# Patient Record
Sex: Male | Born: 1980 | Race: Black or African American | Hispanic: No | Marital: Single | Smoking: Heavy tobacco smoker
Health system: Southern US, Community
[De-identification: ages and names within clinical notes are randomized; demographics above are authoritative.]

---

## 2000-02-05 ENCOUNTER — Emergency Department (HOSPITAL_COMMUNITY): Admission: EM | Admit: 2000-02-05 | Discharge: 2000-02-05 | Payer: Self-pay | Admitting: Emergency Medicine

## 2008-11-11 ENCOUNTER — Emergency Department (HOSPITAL_COMMUNITY): Admission: EM | Admit: 2008-11-11 | Discharge: 2008-11-11 | Payer: Self-pay | Admitting: Emergency Medicine

## 2010-04-13 ENCOUNTER — Emergency Department (HOSPITAL_COMMUNITY): Admission: EM | Admit: 2010-04-13 | Discharge: 2010-04-13 | Payer: Self-pay | Admitting: Emergency Medicine

## 2010-04-16 ENCOUNTER — Emergency Department (HOSPITAL_COMMUNITY): Admission: EM | Admit: 2010-04-16 | Discharge: 2010-04-16 | Payer: Self-pay | Admitting: Emergency Medicine

## 2010-10-26 LAB — RAPID STREP SCREEN (MED CTR MEBANE ONLY): Streptococcus, Group A Screen (Direct): NEGATIVE

## 2013-04-27 ENCOUNTER — Emergency Department (HOSPITAL_COMMUNITY)
Admission: EM | Admit: 2013-04-27 | Discharge: 2013-04-27 | Disposition: A | Discharge status: Home / Self Care | Payer: Self-pay | Attending: Emergency Medicine | Admitting: Emergency Medicine

## 2013-04-27 ENCOUNTER — Emergency Department (HOSPITAL_COMMUNITY): Payer: Self-pay

## 2013-04-27 ENCOUNTER — Encounter (HOSPITAL_COMMUNITY): Payer: Self-pay | Admitting: Emergency Medicine

## 2013-04-27 DIAGNOSIS — S0083XA Contusion of other part of head, initial encounter: Secondary | ICD-10-CM

## 2013-04-27 DIAGNOSIS — Y9389 Activity, other specified: Secondary | ICD-10-CM | POA: Insufficient documentation

## 2013-04-27 DIAGNOSIS — Y92410 Unspecified street and highway as the place of occurrence of the external cause: Secondary | ICD-10-CM | POA: Insufficient documentation

## 2013-04-27 DIAGNOSIS — IMO0002 Reserved for concepts with insufficient information to code with codable children: Secondary | ICD-10-CM | POA: Insufficient documentation

## 2013-04-27 DIAGNOSIS — S0003XA Contusion of scalp, initial encounter: Secondary | ICD-10-CM | POA: Insufficient documentation

## 2013-04-27 DIAGNOSIS — F17200 Nicotine dependence, unspecified, uncomplicated: Secondary | ICD-10-CM | POA: Insufficient documentation

## 2013-04-27 DIAGNOSIS — R141 Gas pain: Secondary | ICD-10-CM | POA: Insufficient documentation

## 2013-04-27 DIAGNOSIS — R142 Eructation: Secondary | ICD-10-CM | POA: Insufficient documentation

## 2013-04-27 DIAGNOSIS — S3981XA Other specified injuries of abdomen, initial encounter: Secondary | ICD-10-CM | POA: Insufficient documentation

## 2013-04-27 DIAGNOSIS — S022XXA Fracture of nasal bones, initial encounter for closed fracture: Secondary | ICD-10-CM | POA: Insufficient documentation

## 2013-04-27 DIAGNOSIS — T07XXXA Unspecified multiple injuries, initial encounter: Secondary | ICD-10-CM

## 2013-04-27 DIAGNOSIS — S0990XA Unspecified injury of head, initial encounter: Secondary | ICD-10-CM | POA: Insufficient documentation

## 2013-04-27 DIAGNOSIS — S298XXA Other specified injuries of thorax, initial encounter: Secondary | ICD-10-CM | POA: Insufficient documentation

## 2013-04-27 LAB — CBC WITH DIFFERENTIAL/PLATELET
Basophils Absolute: 0 10*3/uL (ref 0.0–0.1)
HCT: 43 % (ref 39.0–52.0)
Hemoglobin: 15.3 g/dL (ref 13.0–17.0)
Lymphocytes Relative: 52 % — ABNORMAL HIGH (ref 12–46)
MCHC: 35.6 g/dL (ref 30.0–36.0)
MCV: 94.1 fL (ref 78.0–100.0)
Monocytes Absolute: 0.5 10*3/uL (ref 0.1–1.0)
Monocytes Relative: 6 % (ref 3–12)
Neutro Abs: 3.5 10*3/uL (ref 1.7–7.7)
RDW: 13.4 % (ref 11.5–15.5)
WBC: 8.4 10*3/uL (ref 4.0–10.5)

## 2013-04-27 LAB — COMPREHENSIVE METABOLIC PANEL
AST: 22 U/L (ref 0–37)
Albumin: 4.7 g/dL (ref 3.5–5.2)
Alkaline Phosphatase: 70 U/L (ref 39–117)
BUN: 14 mg/dL (ref 6–23)
CO2: 26 mEq/L (ref 19–32)
Calcium: 9 mg/dL (ref 8.4–10.5)
Chloride: 99 mEq/L (ref 96–112)
Creatinine, Ser: 0.84 mg/dL (ref 0.50–1.35)
GFR calc Af Amer: >90 mL/min (ref >90)
GFR calc non Af Amer: >90 mL/min (ref >90)
Sodium: 135 mEq/L (ref 135–145)
Total Bilirubin: 0.9 mg/dL (ref 0.3–1.2)

## 2013-04-27 LAB — URINALYSIS, ROUTINE W REFLEX MICROSCOPIC
Glucose, UA: NEGATIVE mg/dL
Leukocytes, UA: NEGATIVE
Nitrite: NEGATIVE
Protein, ur: NEGATIVE mg/dL
Specific Gravity, Urine: 1.015 (ref 1.005–1.030)
Urobilinogen, UA: 1 mg/dL (ref 0.0–1.0)

## 2013-04-27 LAB — ETHANOL: Alcohol, Ethyl (B): 311 mg/dL — ABNORMAL HIGH (ref 0–11)

## 2013-04-27 LAB — URINE MICROSCOPIC-ADD ON

## 2013-04-27 LAB — RAPID URINE DRUG SCREEN, HOSP PERFORMED
Amphetamines: NOT DETECTED
Barbiturates: NOT DETECTED
Opiates: NOT DETECTED

## 2013-04-27 MED ORDER — HYDROCODONE-ACETAMINOPHEN 5-325 MG PO TABS: 1.0000 | ORAL_TABLET | Freq: Four times a day (QID) | ORAL | Status: DC | PRN

## 2013-04-27 MED ORDER — ONDANSETRON HCL 4 MG/2ML IJ SOLN
4.0000 mg | Freq: Once | INTRAMUSCULAR | Status: AC
  Administered 2013-04-27: 4 mg via INTRAVENOUS
  Filled 2013-04-27: qty 2

## 2013-04-27 MED ORDER — IOHEXOL 300 MG/ML  SOLN
100.0000 mL | Freq: Once | INTRAMUSCULAR | Status: AC | PRN
  Administered 2013-04-27: 100 mL via INTRAVENOUS

## 2013-04-27 MED ORDER — HYDROMORPHONE HCL PF 1 MG/ML IJ SOLN
1.0000 mg | Freq: Once | INTRAMUSCULAR | Status: AC
  Administered 2013-04-27: 1 mg via INTRAVENOUS
  Filled 2013-04-27: qty 1

## 2013-04-27 MED ORDER — SODIUM CHLORIDE 0.9 % IV SOLN
Freq: Once | INTRAVENOUS | Status: AC
  Administered 2013-04-27: 04:00:00 via INTRAVENOUS

## 2013-04-27 MED ORDER — HYDROGEN PEROXIDE 3 % EX SOLN
CUTANEOUS | Status: AC
  Filled 2013-04-27: qty 473

## 2013-04-27 NOTE — ED Provider Notes (Signed)
Medical screening examination/treatment/procedure(s) were performed by non-physician practitioner and as supervising physician I was immediately available for consultation/collaboration.  Olivia Mackie, MD 04/27/13 [phone]
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
## 2013-04-27 NOTE — ED Notes (Signed)
Spoke with [place] at lab,  She is adding on Ethanol to lab tube already in lab

## 2013-04-27 NOTE — ED Notes (Signed)
Pt discharged and released to jail with [employer]

## 2013-07-06 ENCOUNTER — Emergency Department (HOSPITAL_COMMUNITY)
Admission: EM | Admit: 2013-07-06 | Discharge: 2013-07-07 | Discharge status: Against Medical Advice | Payer: Self-pay | Attending: Emergency Medicine | Admitting: Emergency Medicine

## 2013-07-06 ENCOUNTER — Encounter (HOSPITAL_COMMUNITY): Payer: Self-pay | Admitting: Emergency Medicine

## 2013-07-06 DIAGNOSIS — R109 Unspecified abdominal pain: Secondary | ICD-10-CM

## 2013-07-06 DIAGNOSIS — R112 Nausea with vomiting, unspecified: Secondary | ICD-10-CM | POA: Insufficient documentation

## 2013-07-06 DIAGNOSIS — R197 Diarrhea, unspecified: Secondary | ICD-10-CM | POA: Insufficient documentation

## 2013-07-06 DIAGNOSIS — F17200 Nicotine dependence, unspecified, uncomplicated: Secondary | ICD-10-CM | POA: Insufficient documentation

## 2013-07-06 NOTE — ED Provider Notes (Signed)
CSN: [id number]     Arrival date & time 07/06/13  1134 History   First MD Initiated Contact with Patient 07/06/13 1215     Chief Complaint  Patient presents with  . Abdominal Pain  . Diarrhea  . Nausea   (Consider location/radiation/quality/duration/timing/severity/associated sxs/prior Treatment) HPI  History reviewed. No pertinent past medical history. History reviewed. No pertinent past surgical history. No family history on file. History  Substance Use Topics  . Smoking status: Heavy Tobacco Smoker -- 1.00 packs/day    Types: Cigarettes  . Smokeless tobacco: Not on file  . Alcohol Use: Yes    Review of Systems  Allergies  Review of patient's allergies indicates no known allergies.  Home Medications   Current Outpatient Rx  Name  Route  Sig  Dispense  Refill  . HYDROcodone-acetaminophen (NORCO/VICODIN) 5-325 MG per tablet   Oral   Take 1 tablet by mouth every 6 (six) hours as needed for pain.   10 tablet   0    BP 116/80  Pulse 74  Temp(Src) 97.7 F (36.5 C) (Oral)  Resp 20  Ht 5\' 7"  (1.702 m)  Wt 165 lb (74.844 kg)  BMI 25.84 kg/m2  SpO2 100% Physical Exam  ED Course  Procedures (including critical care time) Labs Review Labs Reviewed  CBC WITH DIFFERENTIAL  COMPREHENSIVE METABOLIC PANEL  URINALYSIS, ROUTINE W REFLEX MICROSCOPIC   Imaging Review No results found.  EKG Interpretation   None       MDM  Patient not in room for me to evaluate.    Izola Price Marisue Humble, PA-C 07/06/13 1259

## 2013-07-06 NOTE — ED Notes (Signed)
Pt c/o mid abdominal pain with diarrhea, vomiting and nausea onset Friday. Pt reports others at his job with similar symptoms. Pt has not tried any other medications for symptoms.

## 2013-12-12 ENCOUNTER — Emergency Department (HOSPITAL_COMMUNITY): Payer: Managed Care, Other (non HMO)

## 2013-12-12 ENCOUNTER — Encounter (HOSPITAL_COMMUNITY): Payer: Self-pay | Admitting: Emergency Medicine

## 2013-12-12 ENCOUNTER — Emergency Department (HOSPITAL_COMMUNITY)
Admission: EM | Admit: 2013-12-12 | Discharge: 2013-12-12 | Disposition: A | Discharge status: Home / Self Care | Payer: Managed Care, Other (non HMO) | Attending: Emergency Medicine | Admitting: Emergency Medicine

## 2013-12-12 DIAGNOSIS — N508 Other specified disorders of male genital organs: Secondary | ICD-10-CM | POA: Insufficient documentation

## 2013-12-12 DIAGNOSIS — F17200 Nicotine dependence, unspecified, uncomplicated: Secondary | ICD-10-CM | POA: Insufficient documentation

## 2013-12-12 DIAGNOSIS — R300 Dysuria: Secondary | ICD-10-CM | POA: Insufficient documentation

## 2013-12-12 DIAGNOSIS — R109 Unspecified abdominal pain: Secondary | ICD-10-CM | POA: Insufficient documentation

## 2013-12-12 DIAGNOSIS — R1032 Left lower quadrant pain: Secondary | ICD-10-CM

## 2013-12-12 DIAGNOSIS — R1031 Right lower quadrant pain: Secondary | ICD-10-CM

## 2013-12-12 LAB — HIV ANTIBODY (ROUTINE TESTING W REFLEX): HIV 1&2 Ab, 4th Generation: NONREACTIVE

## 2013-12-12 LAB — URINALYSIS, ROUTINE W REFLEX MICROSCOPIC
Bilirubin Urine: NEGATIVE
GLUCOSE, UA: NEGATIVE mg/dL
Ketones, ur: NEGATIVE mg/dL
Nitrite: NEGATIVE
PH: 6 (ref 5.0–8.0)
PROTEIN: NEGATIVE mg/dL
SPECIFIC GRAVITY, URINE: 1.024 (ref 1.005–1.030)
Urobilinogen, UA: 1 mg/dL (ref 0.0–1.0)

## 2013-12-12 LAB — URINE MICROSCOPIC-ADD ON

## 2013-12-12 MED ORDER — CEFTRIAXONE SODIUM 250 MG IJ SOLR
250.0000 mg | Freq: Once | INTRAMUSCULAR | Status: AC
  Administered 2013-12-12: 250 mg via INTRAMUSCULAR
  Filled 2013-12-12: qty 250

## 2013-12-12 MED ORDER — AZITHROMYCIN 250 MG PO TABS
1000.0000 mg | ORAL_TABLET | Freq: Once | ORAL | Status: AC
  Administered 2013-12-12: 1000 mg via ORAL
  Filled 2013-12-12: qty 4

## 2013-12-12 NOTE — ED Notes (Signed)
Pt reports pain in the crease between leg and abd for past week that is intermittent. Pt states L has pain more than R side. No n/v/d.

## 2013-12-12 NOTE — ED Provider Notes (Signed)
CSN: [id number]     Arrival date & time 12/12/13  1618 History   First MD Initiated Contact with Patient 12/12/13 1633     Chief Complaint  Patient presents with  . Abdominal Pain     (Consider location/radiation/quality/duration/timing/severity/associated sxs/prior Treatment) HPI  Patient presents with bilateral inguinal pain, L>R, and burning and itching following urination x 1 week.  Pain in inguinal region is sharp, occurs at random, lasts hours at a time, currently 5/10.  Has burning and itching in the penis after urination.  Denies fevers, N/V, dysuria, urinary frequency or urgency, penile discharge, scrotal swelling or testicular pain, genital lesions, change in bowel habits.  Is sexually active, has had unprotected sex recently with new partner.    Denies hx inguinal hernia, hx abdominal surgery.    History reviewed. No pertinent past medical history. History reviewed. No pertinent past surgical history. History reviewed. No pertinent family history. History  Substance Use Topics  . Smoking status: Heavy Tobacco Smoker -- 1.00 packs/day    Types: Cigarettes  . Smokeless tobacco: Not on file  . Alcohol Use: Yes    Review of Systems  All other systems reviewed and are negative.     Allergies  Review of patient's allergies indicates no known allergies.  Home Medications   Prior to Admission medications   Medication Sig Start Date End Date Taking? Authorizing Provider  HYDROcodone-acetaminophen (NORCO/VICODIN) 5-325 MG per tablet Take 1 tablet by mouth every 6 (six) hours as needed for pain. 04/27/13   Arman Filter, NP   BP 132/92  Pulse 75  Temp(Src) 98.1 F (36.7 C) (Oral)  Resp 16  SpO2 98% Physical Exam  Nursing note and vitals reviewed. Constitutional: He appears well-developed and well-nourished. No distress.  HENT:  Head: Normocephalic and atraumatic.  Neck: Neck supple.  Cardiovascular: Normal rate and regular rhythm.   Pulmonary/Chest: Effort  normal and breath sounds normal. No respiratory distress. He has no wheezes. He has no rales.  Abdominal: Soft. He exhibits no distension and no mass. There is no tenderness. There is no rebound and no guarding. Hernia confirmed negative in the right inguinal area and confirmed negative in the left inguinal area.  Genitourinary: Right testis shows no swelling and no tenderness. Right testis is descended. Left testis shows mass. Left testis shows no tenderness. Left testis is descended. Circumcised. No discharge found.  Lymphadenopathy:       Right: No inguinal adenopathy present.       Left: No inguinal adenopathy present.  Neurological: He is alert. He exhibits normal muscle tone.  Skin: He is not diaphoretic.    ED Course  Procedures (including critical care time) Labs Review Labs Reviewed  URINALYSIS, ROUTINE W REFLEX MICROSCOPIC - Abnormal; Notable for the following:    Hgb urine dipstick TRACE (*)    Leukocytes, UA MODERATE (*)    All other components within normal limits  URINE MICROSCOPIC-ADD ON - Abnormal; Notable for the following:    Bacteria, UA FEW (*)    All other components within normal limits  GC/CHLAMYDIA PROBE AMP  URINE CULTURE  RPR  HIV ANTIBODY (ROUTINE TESTING)    Imaging Review US Scrotum  12/12/2013   CLINICAL DATA:  Inguinal tenderness with left testicular mass  EXAM: SCROTAL ULTRASOUND  DOPPLER ULTRASOUND OF THE TESTICLES  TECHNIQUE: Complete ultrasound examination of the testicles, epididymis, and other scrotal structures was performed. Color and spectral Doppler ultrasound were also utilized to evaluate blood flow to the testicles.  COMPARISON:  None.  FINDINGS: Right testicle  Measurements: 4.3 x 2.2 x 3.9 cm. No mass or microlithiasis visualized.  Left testicle  Measurements: 5.0 x 1.8 x 3.5 cm. No mass or microlithiasis visualized.  Right epididymis:  Normal in size and appearance.  Left epididymis:  Normal in size and appearance.  Hydrocele:  None  visualized.  Varicocele:  None visualized.  Pulsed Doppler interrogation of both testes demonstrates low resistance arterial and venous waveforms bilaterally.  IMPRESSION: No acute abnormality noted.   Electronically Signed   By: Alcide CleverMark  Lukens M.D.   On: 12/12/2013 18:27   [place] Art/ven Flow Abd Pelv Doppler  12/12/2013   CLINICAL DATA:  Inguinal tenderness with left testicular mass  EXAM: SCROTAL ULTRASOUND  DOPPLER ULTRASOUND OF THE TESTICLES  TECHNIQUE: Complete ultrasound examination of the testicles, epididymis, and other scrotal structures was performed. Color and spectral Doppler ultrasound were also utilized to evaluate blood flow to the testicles.  COMPARISON:  None.  FINDINGS: Right testicle  Measurements: 4.3 x 2.2 x 3.9 cm. No mass or microlithiasis visualized.  Left testicle  Measurements: 5.0 x 1.8 x 3.5 cm. No mass or microlithiasis visualized.  Right epididymis:  Normal in size and appearance.  Left epididymis:  Normal in size and appearance.  Hydrocele:  None visualized.  Varicocele:  None visualized.  Pulsed Doppler interrogation of both testes demonstrates low resistance arterial and venous waveforms bilaterally.  IMPRESSION: No acute abnormality noted.   Electronically Signed   By: Alcide CleverMark  Lukens M.D.   On: 12/12/2013 18:27     EKG Interpretation None      Dr Wesley Bolton has also seen and performed genital exam on patient - suspects engorged epididymus.    MDM   Final diagnoses:  Dysuria  Inguinal pain of both sides    Pt with bilateral inguinal pain without adenopathy or hernia, pain/burning after urination and recent unprotected sex with new partner.  Pt without testicular pain but found to have mass on exam, normal US, Dr Wesley Bolton has also performed genital exam (see above).  Likely STD.  Pt opted to be treated for GC/Chlam.  Pt aware all STD/HIV results are pending.  D/C home.  Discussed result, findings, treatment, and follow up  with patient.  Pt given return precautions.  Pt  verbalizes understanding and agrees with plan.         [name], PA-C 12/12/13 2154

## 2013-12-12 NOTE — ED Notes (Addendum)
Patient reports pain he rates as a 7 in his left groin area.  Discomfort when he urinates but no penile discharge.  This has been going on for about a week.  Patient said he had unprotected sex with new partner about three days prior to symptoms.

## 2013-12-12 NOTE — ED Provider Notes (Signed)
Medical screening examination/treatment/procedure(s) were conducted as a shared visit with non-physician practitioner(s) and myself.  I personally evaluated the patient during the encounter.   EKG Interpretation None      Patient here with bilateral inguinal pain. No penile or testicluar pain. Small mass on exam above L testicle, c/w engorged epididymis. [place] normal.   Dagmar Hait, MD 12/12/13 [phone]

## 2013-12-12 NOTE — Discharge Instructions (Signed)
Read the information below.  You may return to the Emergency Department at any time for worsening condition or any new symptoms that concern you.   Dysuria Dysuria is the medical term for pain with urination. There are many causes for dysuria, but urinary tract infection is the most common. If a urinalysis was performed it can show that there is a urinary tract infection. A urine culture confirms that you or your child is sick. You will need to follow up with a healthcare provider because:  If a urine culture was done you will need to know the culture results and treatment recommendations.  If the urine culture was positive, you or your child will need to be put on antibiotics or know if the antibiotics prescribed are the right antibiotics for your urinary tract infection.  If the urine culture is negative (no urinary tract infection), then other causes may need to be explored or antibiotics need to be stopped. Today laboratory work may have been done and there does not seem to be an infection. If cultures were done they will take at least 24 to 48 hours to be completed. Today x-rays may have been taken and they read as normal. No cause can be found for the problems. The x-rays may be re-read by a radiologist and you will be contacted if additional findings are made. You or your child may have been put on medications to help with this problem until you can see your primary caregiver. If the problems get better, see your primary caregiver if the problems return. If you were given antibiotics (medications which kill germs), take all of the mediations as directed for the full course of treatment.  If laboratory work was done, you need to find the results. Leave a telephone number where you can be reached. If this is not possible, make sure you find out how you are to get test results. HOME CARE INSTRUCTIONS   Drink lots of fluids. For adults, drink eight, 8 ounce glasses of clear juice or water a day.  For children, replace fluids as suggested by your caregiver.  Empty the bladder often. Avoid holding urine for long periods of time.  After a bowel movement, women should cleanse front to back, using each tissue only once.  Empty your bladder before and after sexual intercourse.  Take all the medicine given to you until it is gone. You may feel better in a few days, but TAKE ALL MEDICINE.  Avoid caffeine, tea, alcohol and carbonated beverages, because they tend to irritate the bladder.  In men, alcohol may irritate the prostate.  Only take over-the-counter or prescription medicines for pain, discomfort, or fever as directed by your caregiver.  If your caregiver has given you a follow-up appointment, it is very important to keep that appointment. Not keeping the appointment could result in a chronic or permanent injury, pain, and disability. If there is any problem keeping the appointment, you must call back to this facility for assistance. SEEK IMMEDIATE MEDICAL CARE IF:   Back pain develops.  A fever develops.  There is nausea (feeling sick to your stomach) or vomiting (throwing up).  Problems are no better with medications or are getting worse. MAKE SURE YOU:   Understand these instructions.  Will watch your condition.  Will get help right away if you are not doing well or get worse. Document Released: 03/31/2004 Document Revised: 09/25/2011 Document Reviewed: 02/06/2008 [facility] Patient Information 2014 [place], [place].

## 2013-12-13 LAB — GC/CHLAMYDIA PROBE AMP
CT PROBE, AMP APTIMA: NEGATIVE
GC Probe RNA: NEGATIVE

## 2013-12-13 LAB — RPR

## 2013-12-14 LAB — URINE CULTURE
Colony Count: NO GROWTH
Culture: NO GROWTH
SPECIAL REQUESTS: NORMAL

## 2016-03-15 IMAGING — US US SCROTUM
1 series · 14 of 25 positions shown · non-contrast
Comparison: None.

CLINICAL DATA: Inguinal tenderness with left testicular mass

EXAM:
SCROTAL ULTRASOUND
DOPPLER ULTRASOUND OF THE TESTICLES
TECHNIQUE: Complete ultrasound examination of the testicles, epididymis, and
other scrotal structures was performed. Color and spectral Doppler
ultrasound were also utilized to evaluate blood flow to the
testicles.

[Series 1: us scrotum · 0.09mm/px · 14 of 32 slices shown]
[im 1/32]
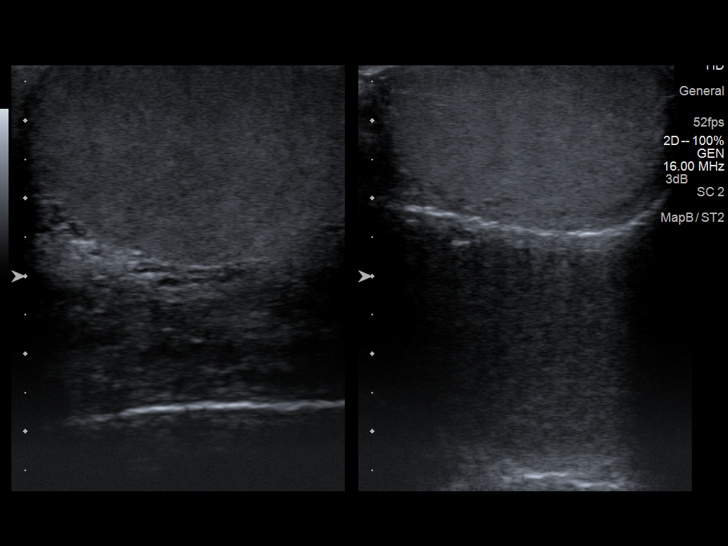
[im 3/32]
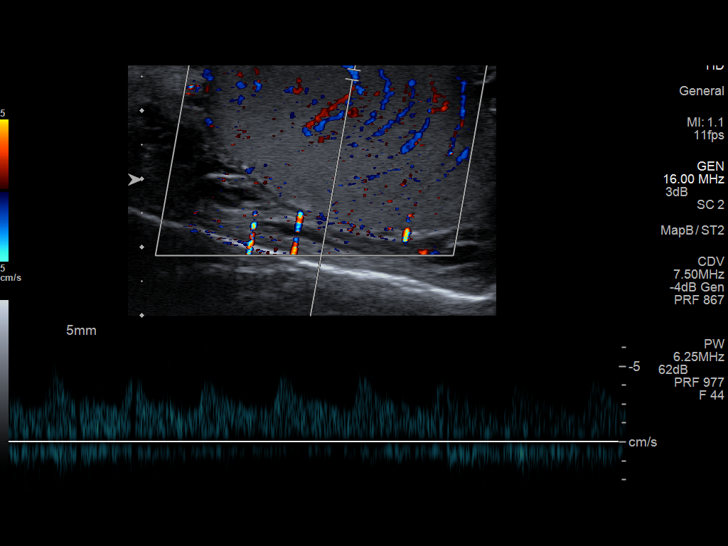
[im 6/32]
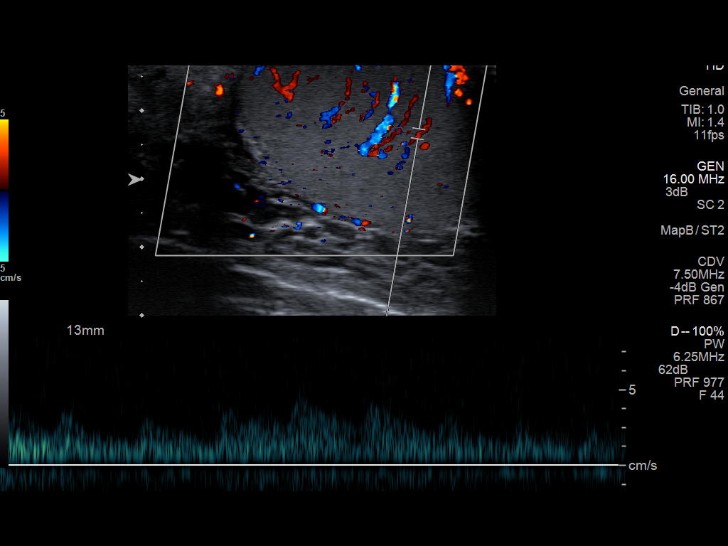
[im 8/32]
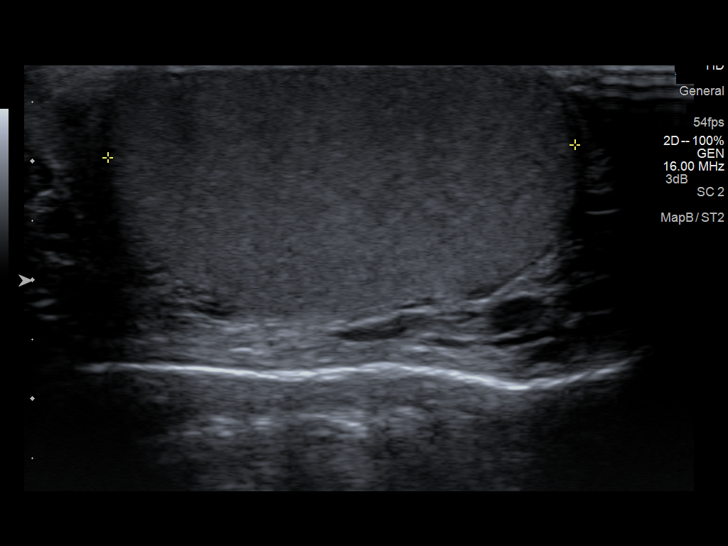
[im 11/32]
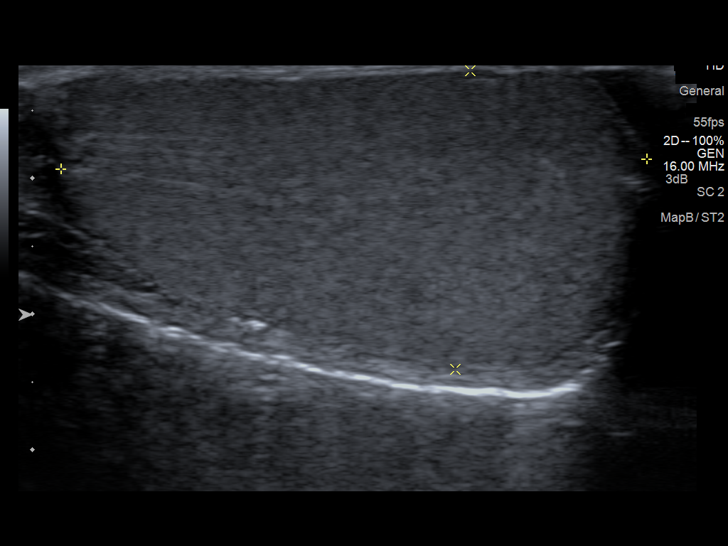
[im 12/32]
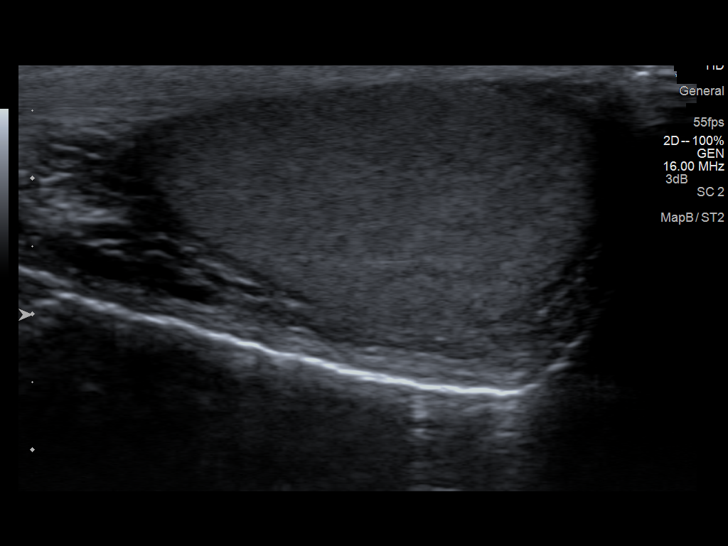
[im 15/32]
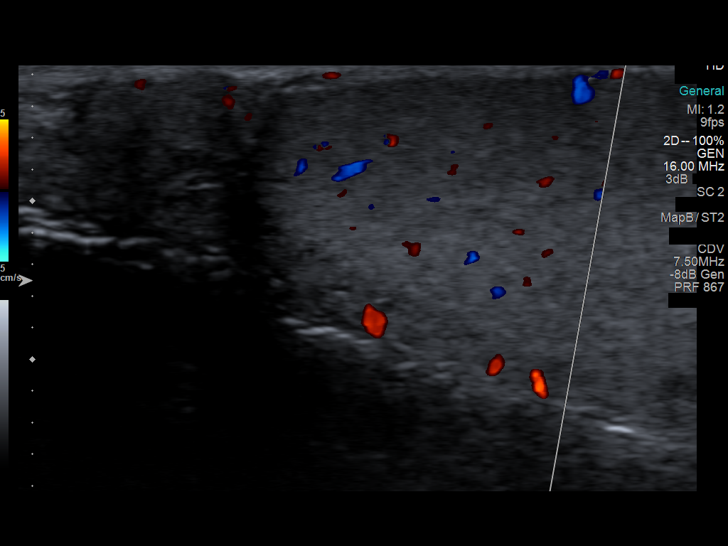
[im 17/32]
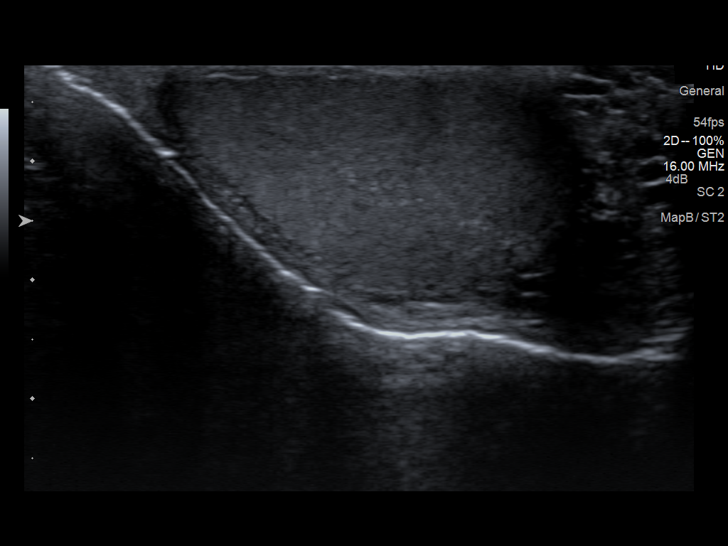
[im 20/32]
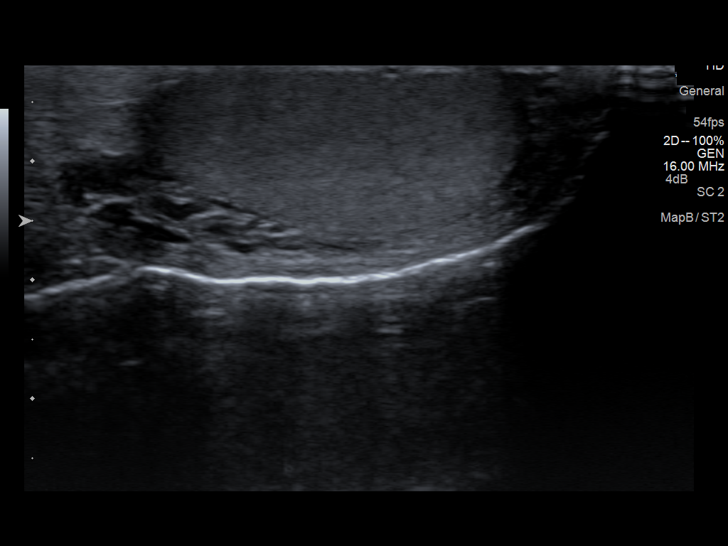
[im 21/32]
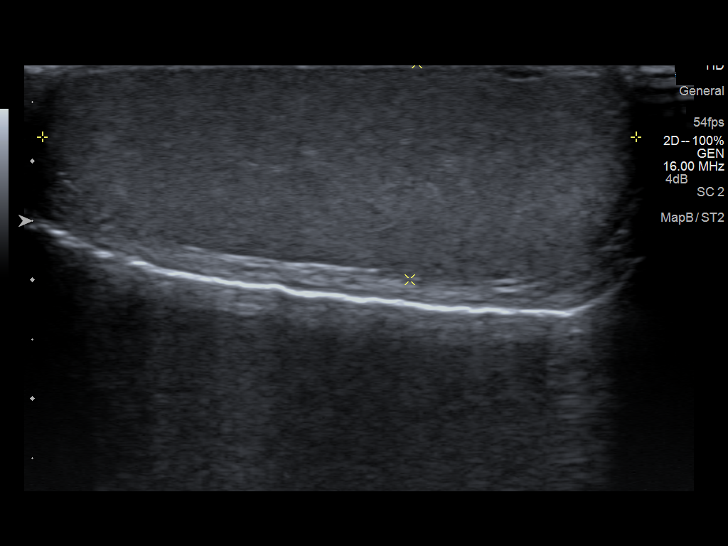
[im 24/32]
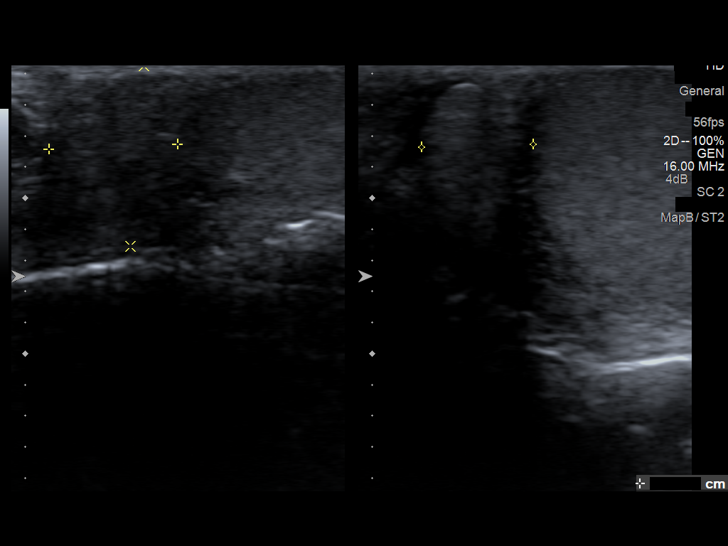
[im 26/32]
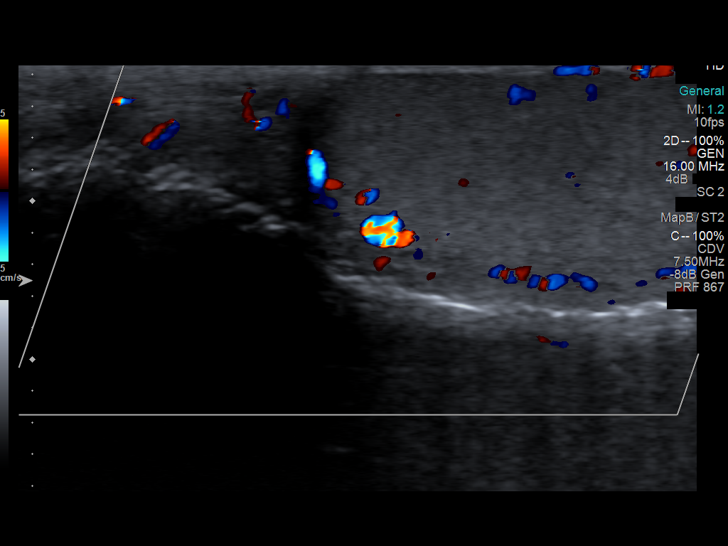
[im 29/32]
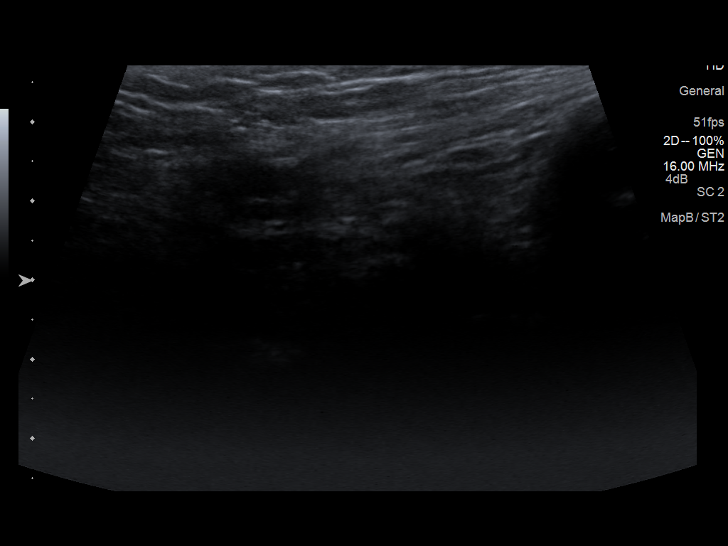
[im 32/32]
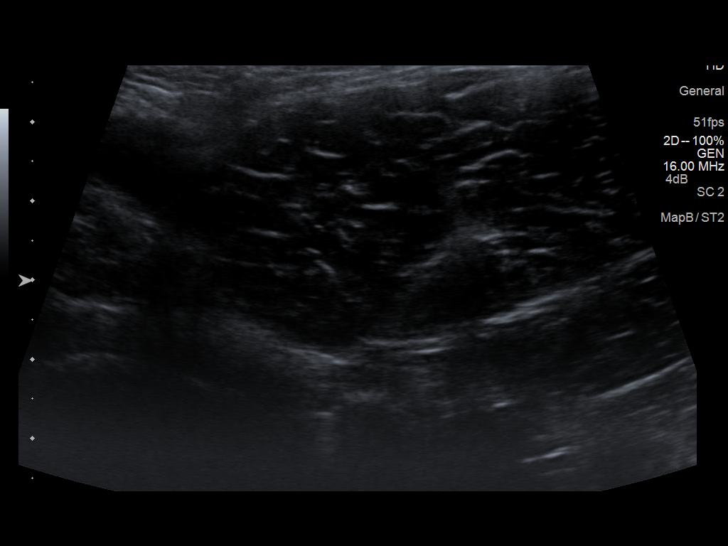

[14 of 25 positions shown; findings below may reference images not displayed]

FINDINGS: Right testicle

Measurements: 4.3 x 2.2 x 3.9 cm.. No mass or microlithiasis
visualized.

Left testicle

Measurements: 5.0 x 1.8 x 3.5 cm.. No mass or microlithiasis
visualized.

Right epididymis:  Normal in size and appearance.

Left epididymis:  Normal in size and appearance.

Hydrocele:  None visualized.

Varicocele:  None visualized.

Pulsed Doppler interrogation of both testes demonstrates low
resistance arterial and venous waveforms bilaterally.
IMPRESSION: No acute abnormality noted.

## 2019-12-27 ENCOUNTER — Encounter (HOSPITAL_COMMUNITY): Payer: Self-pay

## 2019-12-27 ENCOUNTER — Other Ambulatory Visit: Payer: Self-pay

## 2019-12-27 ENCOUNTER — Emergency Department (HOSPITAL_COMMUNITY)
Admission: EM | Admit: 2019-12-27 | Discharge: 2019-12-27 | Disposition: A | Discharge status: Home / Self Care | Payer: Managed Care, Other (non HMO) | Attending: Emergency Medicine | Admitting: Emergency Medicine

## 2019-12-27 DIAGNOSIS — N342 Other urethritis: Secondary | ICD-10-CM | POA: Insufficient documentation

## 2019-12-27 DIAGNOSIS — M545 Low back pain, unspecified: Secondary | ICD-10-CM

## 2019-12-27 DIAGNOSIS — F17210 Nicotine dependence, cigarettes, uncomplicated: Secondary | ICD-10-CM | POA: Insufficient documentation

## 2019-12-27 DIAGNOSIS — R112 Nausea with vomiting, unspecified: Secondary | ICD-10-CM

## 2019-12-27 DIAGNOSIS — Z20822 Contact with and (suspected) exposure to covid-19: Secondary | ICD-10-CM | POA: Insufficient documentation

## 2019-12-27 LAB — CBC
HCT: 40.7 % (ref 39.0–52.0)
Hemoglobin: 14.4 g/dL (ref 13.0–17.0)
MCH: 34.9 pg — ABNORMAL HIGH (ref 26.0–34.0)
MCHC: 35.4 g/dL (ref 30.0–36.0)
MCV: 98.5 fL (ref 80.0–100.0)
Platelets: 279 10*3/uL (ref 150–400)
RBC: 4.13 MIL/uL — ABNORMAL LOW (ref 4.22–5.81)
RDW: 13 % (ref 11.5–15.5)
WBC: 6.4 10*3/uL (ref 4.0–10.5)
nRBC: 0 % (ref 0.0–0.2)

## 2019-12-27 LAB — LIPASE, BLOOD: Lipase: 25 U/L (ref 11–51)

## 2019-12-27 LAB — URINALYSIS, ROUTINE W REFLEX MICROSCOPIC
Bacteria, UA: NONE SEEN
Bilirubin Urine: NEGATIVE
Glucose, UA: NEGATIVE mg/dL
Hgb urine dipstick: NEGATIVE
Ketones, ur: 5 mg/dL — AB
Leukocytes,Ua: NEGATIVE
Nitrite: NEGATIVE
Protein, ur: 30 mg/dL — AB
Specific Gravity, Urine: 1.028 (ref 1.005–1.030)
pH: 5 (ref 5.0–8.0)

## 2019-12-27 LAB — COMPREHENSIVE METABOLIC PANEL
ALT: 22 U/L (ref 0–44)
AST: 26 U/L (ref 15–41)
Albumin: 4.5 g/dL (ref 3.5–5.0)
Alkaline Phosphatase: 55 U/L (ref 38–126)
Anion gap: 12 (ref 5–15)
BUN: 17 mg/dL (ref 6–20)
CO2: 24 mmol/L (ref 22–32)
Calcium: 8.9 mg/dL (ref 8.9–10.3)
Chloride: 101 mmol/L (ref 98–111)
Creatinine, Ser: 0.94 mg/dL (ref 0.61–1.24)
GFR calc Af Amer: >60 mL/min (ref >60)
GFR calc non Af Amer: >60 mL/min (ref >60)
Glucose, Bld: 108 mg/dL — ABNORMAL HIGH (ref 70–99)
Potassium: 3.8 mmol/L (ref 3.5–5.1)
Sodium: 137 mmol/L (ref 135–145)
Total Bilirubin: 1.8 mg/dL — ABNORMAL HIGH (ref 0.3–1.2)
Total Protein: 7.5 g/dL (ref 6.5–8.1)

## 2019-12-27 LAB — SARS CORONAVIRUS 2 BY RT PCR (HOSPITAL ORDER, PERFORMED IN ~~LOC~~ HOSPITAL LAB): SARS Coronavirus 2: NEGATIVE

## 2019-12-27 MED ORDER — CEFTRIAXONE SODIUM 1 G IJ SOLR
500.0000 mg | Freq: Once | INTRAMUSCULAR | Status: AC
  Administered 2019-12-27: 500 mg via INTRAMUSCULAR
  Filled 2019-12-27: qty 10

## 2019-12-27 MED ORDER — DOXYCYCLINE HYCLATE 100 MG PO CAPS: 100.0000 mg | ORAL_CAPSULE | Freq: Two times a day (BID) | ORAL | 0 refills | Status: AC

## 2019-12-27 MED ORDER — ONDANSETRON 8 MG PO TBDP: 8.0000 mg | ORAL_TABLET | Freq: Three times a day (TID) | ORAL | 0 refills | Status: AC | PRN

## 2019-12-27 MED ORDER — STERILE WATER FOR INJECTION IJ SOLN
INTRAMUSCULAR | Status: AC
  Administered 2019-12-27: 2.1 mL
  Filled 2019-12-27: qty 10

## 2019-12-27 NOTE — ED Triage Notes (Signed)
Pt reports lower back pain x 1 month.   Also reports vomiting about every other day for a month. States that he saw some blood in his emesis yesterday.  Also reports that he woke up earlier this week with blood in his urethra. He states that it wasn't in his urine, it was around his urethra. Endorses some irritation with urination. Would like to be checked for STDs because he is unsure of the status of one of his partners.

## 2019-12-27 NOTE — ED Provider Notes (Signed)
[facility] DEPT Provider Note   CSN: [id number] Arrival date & time: 12/27/19  0559     History Chief Complaint  Patient presents with  . Back Pain  . Exposure to STD  . Hematemesis    Wesley Bolton is a 39 y.o. male.  HPI    39 yo male complaining of nausea and vomiting for 4 days although noted some nausea and gagging prior to this.  Patient reports drinking alcohol daily 5 small bottles.  Emesis for several days liquid emesis, yesterday noted streaks of blood.  Last etoh 2 days.  Denies shakiness or withdrawal symptoms.  States normally drinks first drink in am and has some shakiness some mornings before first drink but has not had any shaking since last drink 2 days ago.  Reports some blood spots from end of urethra, no other discharge noted.  Patient feels there is some irrritation.  Reports 2 partners in past 6 months.  Has had trich in past- 5-7 years ago.  Patient also complains of some low back pain present for 2 months. Pain is in mid low back- worse with certain positions and bending over, no injury.  History reviewed. No pertinent past medical history.  There are no problems to display for this patient.   History reviewed. No pertinent surgical history.     History reviewed. No pertinent family history.  Social History   Tobacco Use  . Smoking status: Heavy Tobacco Smoker    Packs/day: 1.00    Types: Cigarettes  Substance Use Topics  . Alcohol use: Yes  . Drug use: No    Home Medications Prior to Admission medications   Not on File    Allergies    Patient has no known allergies.  Review of Systems   Review of Systems  Constitutional: Positive for appetite change. Negative for chills and fever.  HENT: Negative.   Eyes: Negative.   Respiratory: Negative.   Cardiovascular: Negative.   Gastrointestinal: Negative.   Endocrine: Negative.   Genitourinary: Positive for discharge.  Musculoskeletal: Positive for back  pain.  Skin: Negative.   Allergic/Immunologic: Negative.   Neurological: Negative.   Hematological: Negative.   Psychiatric/Behavioral: Negative.   All other systems reviewed and are negative.   Physical Exam Updated Vital Signs BP 114/79   Pulse 86   Temp 98.5 F (36.9 C) (Oral)   Resp 18   Ht 1.702 m (5\' 7" )   Wt 74.8 kg   SpO2 99%   BMI 25.84 kg/m   Physical Exam Vitals and nursing note reviewed.  Constitutional:      General: He is not in acute distress.    Appearance: Normal appearance.  HENT:     Head: Normocephalic.     Right Ear: External ear normal.     Left Ear: External ear normal.     Nose: Nose normal.     Mouth/Throat:     Pharynx: Oropharynx is clear.  Eyes:     Extraocular Movements: Extraocular movements intact.     Pupils: Pupils are equal, round, and reactive to light.  Cardiovascular:     Rate and Rhythm: Normal rate and regular rhythm.     Pulses: Normal pulses.     Heart sounds: Normal heart sounds.  Pulmonary:     Effort: Pulmonary effort is normal.     Breath sounds: Normal breath sounds.  Abdominal:     General: Abdomen is flat.  Musculoskeletal:  General: Normal range of motion.     Cervical back: Normal range of motion.  Skin:    General: Skin is warm.     Capillary Refill: Capillary refill takes less than 2 seconds.  Neurological:     General: No focal deficit present.     Mental Status: He is alert.  Psychiatric:        Mood and Affect: Mood normal.        Behavior: Behavior normal.     ED Results / Procedures / Treatments   Labs (all labs ordered are listed, but only abnormal results are displayed) Labs Reviewed  COMPREHENSIVE METABOLIC PANEL - Abnormal; Notable for the following components:      Result Value   Glucose, Bld 108 (*)    Total Bilirubin 1.8 (*)    All other components within normal limits  CBC - Abnormal; Notable for the following components:   RBC 4.13 (*)    MCH 34.9 (*)    All other  components within normal limits  LIPASE, BLOOD  URINALYSIS, ROUTINE W REFLEX MICROSCOPIC    EKG None  Radiology No results found.  Procedures Procedures (including critical care time)  Medications Ordered in ED Medications - No data to display  ED Course  I have reviewed the triage vital signs and the nursing notes.  Pertinent labs & imaging results that were available during my care of the patient were reviewed by me and considered in my medical decision making (see chart for details).    MDM Rules/Calculators/A&P                          1- nausea and vomiting- patient tolerating fluids.  No evidence of volume depletion, abdomen is soft and nontender.  We discussed avoiding alcohol, use of Zofran, and oral rehydration 2 low back pain has been present for several months appears to be musculoskeletal in nature.  There is a normal neurological exam and no red flags.  We discussed return precautions and insertive management and therapy 3 urethritis patient had swab obtained here in emergency department.  He is to be treated with Rocephin and doxycycline.  Is advised regarding safe sex practices. For alcohol abuse we have discussed that the amount of alcohol that he is taking and he needs to cut back significantly or stop. Final Clinical Impression(s) / ED Diagnoses Final diagnoses:  Nausea and vomiting, intractability of vomiting not specified, unspecified vomiting type  Urethritis  Low back pain without sciatica, unspecified back pain laterality, unspecified chronicity    Rx / DC Orders ED Discharge Orders    None       Margarita Grizzle, MD 12/27/19 [phone]

## 2019-12-27 NOTE — Discharge Instructions (Signed)
Avoid alcohol Use Zofran as needed for nausea Drink plenty of liquids, specifically Gatorade mixed half-and-half with water as discussed Use Tylenol as needed for back pain Please use condoms and practice safe sex Call number on discharge instructions to establish primary care

## 2019-12-29 LAB — GC/CHLAMYDIA PROBE AMP (~~LOC~~) NOT AT ARMC
Chlamydia: NEGATIVE
Comment: NEGATIVE
Comment: NORMAL
Neisseria Gonorrhea: NEGATIVE
